# Patient Record
Sex: Female | Born: 1945 | Hispanic: No | Marital: Married
Health system: Southern US, Community
[De-identification: ages and names within clinical notes are randomized; demographics above are authoritative.]

## PROBLEM LIST (undated history)

## (undated) HISTORY — PX: BREAST EXCISIONAL BIOPSY: SUR124

---

## 1965-11-10 HISTORY — PX: BREAST BIOPSY: SHX20

## 2004-07-21 ENCOUNTER — Ambulatory Visit: Payer: Self-pay | Admitting: Unknown Physician Specialty

## 2005-08-10 ENCOUNTER — Ambulatory Visit: Payer: Self-pay | Admitting: Unknown Physician Specialty

## 2006-08-16 ENCOUNTER — Ambulatory Visit: Payer: Self-pay | Admitting: Unknown Physician Specialty

## 2007-08-29 ENCOUNTER — Ambulatory Visit: Payer: Self-pay | Admitting: Unknown Physician Specialty

## 2009-06-19 ENCOUNTER — Ambulatory Visit: Payer: Self-pay | Admitting: Unknown Physician Specialty

## 2009-07-04 ENCOUNTER — Ambulatory Visit: Payer: Self-pay | Admitting: Unknown Physician Specialty

## 2009-09-11 ENCOUNTER — Ambulatory Visit: Payer: Self-pay | Admitting: Unknown Physician Specialty

## 2009-11-11 ENCOUNTER — Ambulatory Visit: Payer: Self-pay | Admitting: Specialist

## 2010-09-14 ENCOUNTER — Ambulatory Visit: Payer: Self-pay | Admitting: Unknown Physician Specialty

## 2011-09-22 ENCOUNTER — Ambulatory Visit: Payer: Self-pay

## 2012-03-22 ENCOUNTER — Ambulatory Visit: Payer: Self-pay | Admitting: Family Medicine

## 2012-10-25 ENCOUNTER — Ambulatory Visit: Payer: Self-pay | Admitting: Family Medicine

## 2013-11-23 ENCOUNTER — Ambulatory Visit: Payer: Self-pay | Admitting: Family Medicine

## 2014-11-12 ENCOUNTER — Other Ambulatory Visit: Payer: Self-pay | Admitting: Family Medicine

## 2014-11-12 DIAGNOSIS — Z1231 Encounter for screening mammogram for malignant neoplasm of breast: Secondary | ICD-10-CM

## 2014-12-03 ENCOUNTER — Ambulatory Visit
Admission: RE | Admit: 2014-12-03 | Discharge: 2014-12-03 | Disposition: A | Discharge status: Home / Self Care | Payer: Medicare Other | Source: Ambulatory Visit | Attending: Family Medicine | Admitting: Family Medicine

## 2014-12-03 DIAGNOSIS — Z1231 Encounter for screening mammogram for malignant neoplasm of breast: Secondary | ICD-10-CM | POA: Insufficient documentation

## 2015-05-27 ENCOUNTER — Encounter (INDEPENDENT_AMBULATORY_CARE_PROVIDER_SITE_OTHER): Payer: Medicare Other | Admitting: Ophthalmology

## 2015-05-27 DIAGNOSIS — H43813 Vitreous degeneration, bilateral: Secondary | ICD-10-CM | POA: Diagnosis not present

## 2015-05-27 DIAGNOSIS — H35372 Puckering of macula, left eye: Secondary | ICD-10-CM | POA: Diagnosis not present

## 2015-05-27 DIAGNOSIS — H2513 Age-related nuclear cataract, bilateral: Secondary | ICD-10-CM

## 2015-05-27 DIAGNOSIS — I10 Essential (primary) hypertension: Secondary | ICD-10-CM | POA: Diagnosis not present

## 2015-05-27 DIAGNOSIS — H35033 Hypertensive retinopathy, bilateral: Secondary | ICD-10-CM | POA: Diagnosis not present

## 2015-06-30 ENCOUNTER — Other Ambulatory Visit: Payer: Self-pay | Admitting: Family Medicine

## 2015-06-30 DIAGNOSIS — Z1231 Encounter for screening mammogram for malignant neoplasm of breast: Secondary | ICD-10-CM

## 2015-10-24 ENCOUNTER — Encounter (INDEPENDENT_AMBULATORY_CARE_PROVIDER_SITE_OTHER): Payer: Medicare Other | Admitting: Ophthalmology

## 2015-12-08 ENCOUNTER — Ambulatory Visit: Payer: Medicare Other

## 2015-12-11 ENCOUNTER — Ambulatory Visit
Admission: RE | Admit: 2015-12-11 | Discharge: 2015-12-11 | Disposition: A | Discharge status: Home / Self Care | Payer: Medicare Other | Source: Ambulatory Visit | Attending: Family Medicine | Admitting: Family Medicine

## 2015-12-11 ENCOUNTER — Other Ambulatory Visit: Payer: Self-pay | Admitting: Family Medicine

## 2015-12-11 DIAGNOSIS — Z1231 Encounter for screening mammogram for malignant neoplasm of breast: Secondary | ICD-10-CM

## 2016-07-07 ENCOUNTER — Other Ambulatory Visit: Payer: Self-pay | Admitting: Family Medicine

## 2016-07-07 DIAGNOSIS — Z1231 Encounter for screening mammogram for malignant neoplasm of breast: Secondary | ICD-10-CM

## 2016-12-22 ENCOUNTER — Ambulatory Visit
Admission: RE | Admit: 2016-12-22 | Discharge: 2016-12-22 | Disposition: A | Discharge status: Home / Self Care | Payer: Medicare Other | Source: Ambulatory Visit | Attending: Family Medicine | Admitting: Family Medicine

## 2016-12-22 DIAGNOSIS — Z1231 Encounter for screening mammogram for malignant neoplasm of breast: Secondary | ICD-10-CM | POA: Diagnosis not present

## 2017-02-17 ENCOUNTER — Other Ambulatory Visit: Payer: Self-pay | Admitting: Family Medicine

## 2017-02-17 DIAGNOSIS — M8580 Other specified disorders of bone density and structure, unspecified site: Secondary | ICD-10-CM

## 2017-02-17 DIAGNOSIS — Z780 Asymptomatic menopausal state: Secondary | ICD-10-CM

## 2017-03-23 ENCOUNTER — Inpatient Hospital Stay: Admission: RE | Admit: 2017-03-23 | Payer: Medicare Other | Source: Ambulatory Visit

## 2017-04-25 ENCOUNTER — Inpatient Hospital Stay: Admission: RE | Admit: 2017-04-25 | Payer: Medicare Other | Source: Ambulatory Visit

## 2017-04-26 ENCOUNTER — Ambulatory Visit
Admission: RE | Admit: 2017-04-26 | Discharge: 2017-04-26 | Disposition: A | Discharge status: Home / Self Care | Payer: Medicare Other | Source: Ambulatory Visit | Attending: Family Medicine | Admitting: Family Medicine

## 2017-04-26 DIAGNOSIS — Z780 Asymptomatic menopausal state: Secondary | ICD-10-CM | POA: Insufficient documentation

## 2017-04-26 DIAGNOSIS — M85851 Other specified disorders of bone density and structure, right thigh: Secondary | ICD-10-CM | POA: Diagnosis not present

## 2017-04-26 DIAGNOSIS — M8580 Other specified disorders of bone density and structure, unspecified site: Secondary | ICD-10-CM

## 2017-04-26 DIAGNOSIS — Z13820 Encounter for screening for osteoporosis: Secondary | ICD-10-CM | POA: Diagnosis present

## 2017-08-02 ENCOUNTER — Other Ambulatory Visit: Payer: Self-pay | Admitting: Family Medicine

## 2017-08-02 DIAGNOSIS — Z1239 Encounter for other screening for malignant neoplasm of breast: Secondary | ICD-10-CM

## 2017-12-27 ENCOUNTER — Ambulatory Visit
Admission: RE | Admit: 2017-12-27 | Discharge: 2017-12-27 | Disposition: A | Discharge status: Home / Self Care | Payer: Medicare Other | Source: Ambulatory Visit | Attending: Family Medicine | Admitting: Family Medicine

## 2017-12-27 DIAGNOSIS — Z1231 Encounter for screening mammogram for malignant neoplasm of breast: Secondary | ICD-10-CM | POA: Diagnosis present

## 2017-12-27 DIAGNOSIS — Z1239 Encounter for other screening for malignant neoplasm of breast: Secondary | ICD-10-CM

## 2018-11-14 ENCOUNTER — Other Ambulatory Visit: Payer: Self-pay | Admitting: Family Medicine

## 2018-11-14 DIAGNOSIS — Z1231 Encounter for screening mammogram for malignant neoplasm of breast: Secondary | ICD-10-CM

## 2019-01-01 ENCOUNTER — Ambulatory Visit: Payer: Medicare Other

## 2019-01-23 ENCOUNTER — Ambulatory Visit
Admission: RE | Admit: 2019-01-23 | Discharge: 2019-01-23 | Disposition: A | Discharge status: Home / Self Care | Payer: Medicare Other | Source: Ambulatory Visit | Attending: Family Medicine | Admitting: Family Medicine

## 2019-01-23 ENCOUNTER — Other Ambulatory Visit: Payer: Self-pay

## 2019-01-23 ENCOUNTER — Encounter (INDEPENDENT_AMBULATORY_CARE_PROVIDER_SITE_OTHER): Payer: Self-pay

## 2019-01-23 DIAGNOSIS — Z1231 Encounter for screening mammogram for malignant neoplasm of breast: Secondary | ICD-10-CM | POA: Diagnosis present

## 2019-05-18 ENCOUNTER — Other Ambulatory Visit: Payer: Self-pay

## 2019-05-18 ENCOUNTER — Ambulatory Visit: Payer: Medicare Other | Attending: Internal Medicine

## 2019-05-18 DIAGNOSIS — Z23 Encounter for immunization: Secondary | ICD-10-CM | POA: Insufficient documentation

## 2019-05-18 NOTE — Progress Notes (Signed)
   Covid-19 Vaccination Clinic  Name:  Morgan Wells    MRN: [id number] DOB: 1946-01-22  05/18/2019  [name] was observed post Covid-19 immunization for 15 minutes without incidence. She was provided with Vaccine Information Sheet and instruction to access the V-Safe system.   [name] was instructed to call 911 with any severe reactions post vaccine: Marland Kitchen Difficulty breathing  . Swelling of your face and throat  . A fast heartbeat  . A bad rash all over your body  . Dizziness and weakness    Immunizations Administered    Name Date Dose VIS Date Route   Moderna COVID-19 Vaccine 05/18/2019  4:53 PM 0.5 mL 03/13/2019 Intramuscular   Manufacturer: Moderna   Lot: [id number]   NDC: 56314-970-26

## 2019-06-19 ENCOUNTER — Ambulatory Visit: Payer: Medicare Other | Attending: Internal Medicine

## 2019-06-19 DIAGNOSIS — Z23 Encounter for immunization: Secondary | ICD-10-CM | POA: Insufficient documentation

## 2019-06-19 NOTE — Progress Notes (Signed)
   Covid-19 Vaccination Clinic  Name:  Morgan Wells    MRN: [id number] DOB: [birth year]  06/19/2019  [name] was observed post Covid-19 immunization for 15 minutes without incident. She was provided with Vaccine Information Sheet and instruction to access the V-Safe system.   [name] was instructed to call 911 with any severe reactions post vaccine: Marland Kitchen Difficulty breathing  . Swelling of face and throat  . A fast heartbeat  . A bad rash all over body  . Dizziness and weakness   Immunizations Administered    Name Date Dose VIS Date Route   Moderna COVID-19 Vaccine 06/19/2019  1:07 PM 0.5 mL 03/13/2019 Intramuscular   Manufacturer: Moderna   Lot: [id number]   NDC: 45146-047-99

## 2019-11-19 ENCOUNTER — Other Ambulatory Visit: Payer: Self-pay | Admitting: Family Medicine

## 2019-11-19 DIAGNOSIS — Z1231 Encounter for screening mammogram for malignant neoplasm of breast: Secondary | ICD-10-CM

## 2020-01-24 ENCOUNTER — Ambulatory Visit
Admission: RE | Admit: 2020-01-24 | Discharge: 2020-01-24 | Disposition: A | Discharge status: Home / Self Care | Payer: Medicare Other | Source: Ambulatory Visit | Attending: Family Medicine | Admitting: Family Medicine

## 2020-01-24 ENCOUNTER — Other Ambulatory Visit: Payer: Self-pay

## 2020-01-24 ENCOUNTER — Encounter (INDEPENDENT_AMBULATORY_CARE_PROVIDER_SITE_OTHER): Payer: Self-pay

## 2020-01-24 DIAGNOSIS — Z1231 Encounter for screening mammogram for malignant neoplasm of breast: Secondary | ICD-10-CM | POA: Insufficient documentation

## 2020-12-25 ENCOUNTER — Other Ambulatory Visit: Payer: Self-pay | Admitting: Family Medicine

## 2020-12-25 DIAGNOSIS — Z1231 Encounter for screening mammogram for malignant neoplasm of breast: Secondary | ICD-10-CM

## 2021-02-02 ENCOUNTER — Ambulatory Visit: Payer: Medicare Other

## 2021-02-04 ENCOUNTER — Other Ambulatory Visit: Payer: Self-pay

## 2021-02-04 ENCOUNTER — Ambulatory Visit
Admission: RE | Admit: 2021-02-04 | Discharge: 2021-02-04 | Disposition: A | Discharge status: Home / Self Care | Payer: Medicare Other | Source: Ambulatory Visit | Attending: Family Medicine | Admitting: Family Medicine

## 2021-02-04 DIAGNOSIS — Z1231 Encounter for screening mammogram for malignant neoplasm of breast: Secondary | ICD-10-CM | POA: Insufficient documentation

## 2021-07-01 ENCOUNTER — Other Ambulatory Visit: Payer: Self-pay | Admitting: Family Medicine

## 2021-07-01 DIAGNOSIS — Z1231 Encounter for screening mammogram for malignant neoplasm of breast: Secondary | ICD-10-CM

## 2022-01-12 ENCOUNTER — Other Ambulatory Visit: Payer: Self-pay | Admitting: Family Medicine

## 2022-01-12 DIAGNOSIS — E2839 Other primary ovarian failure: Secondary | ICD-10-CM

## 2022-02-08 ENCOUNTER — Ambulatory Visit
Admission: RE | Admit: 2022-02-08 | Discharge: 2022-02-08 | Disposition: A | Discharge status: Home / Self Care | Payer: Medicare Other | Source: Ambulatory Visit | Attending: Family Medicine | Admitting: Family Medicine

## 2022-02-08 DIAGNOSIS — Z1231 Encounter for screening mammogram for malignant neoplasm of breast: Secondary | ICD-10-CM | POA: Insufficient documentation

## 2022-02-10 ENCOUNTER — Other Ambulatory Visit: Payer: Self-pay | Admitting: Family Medicine

## 2022-02-17 ENCOUNTER — Other Ambulatory Visit: Payer: Self-pay | Admitting: Family Medicine

## 2022-02-17 DIAGNOSIS — R928 Other abnormal and inconclusive findings on diagnostic imaging of breast: Secondary | ICD-10-CM

## 2022-02-17 DIAGNOSIS — R921 Mammographic calcification found on diagnostic imaging of breast: Secondary | ICD-10-CM

## 2022-02-24 ENCOUNTER — Ambulatory Visit
Admission: RE | Admit: 2022-02-24 | Discharge: 2022-02-24 | Disposition: A | Discharge status: Home / Self Care | Payer: Medicare Other | Source: Ambulatory Visit | Attending: Family Medicine | Admitting: Family Medicine

## 2022-02-24 DIAGNOSIS — R921 Mammographic calcification found on diagnostic imaging of breast: Secondary | ICD-10-CM | POA: Diagnosis present

## 2022-02-24 DIAGNOSIS — R928 Other abnormal and inconclusive findings on diagnostic imaging of breast: Secondary | ICD-10-CM | POA: Insufficient documentation

## 2022-02-28 IMAGING — MG MM DIGITAL SCREENING BILAT W/ TOMO AND CAD
8 series · 8 of 24 positions shown · non-contrast
Comparison: Previous exam(s).

CLINICAL DATA: Screening.

EXAM:
DIGITAL SCREENING BILATERAL MAMMOGRAM WITH TOMOSYNTHESIS AND CAD
TECHNIQUE: Bilateral screening digital craniocaudal and mediolateral oblique
mammograms were obtained. Bilateral screening digital breast
tomosynthesis was performed. The images were evaluated with
computer-aided detection.

[R CC synth-2D]
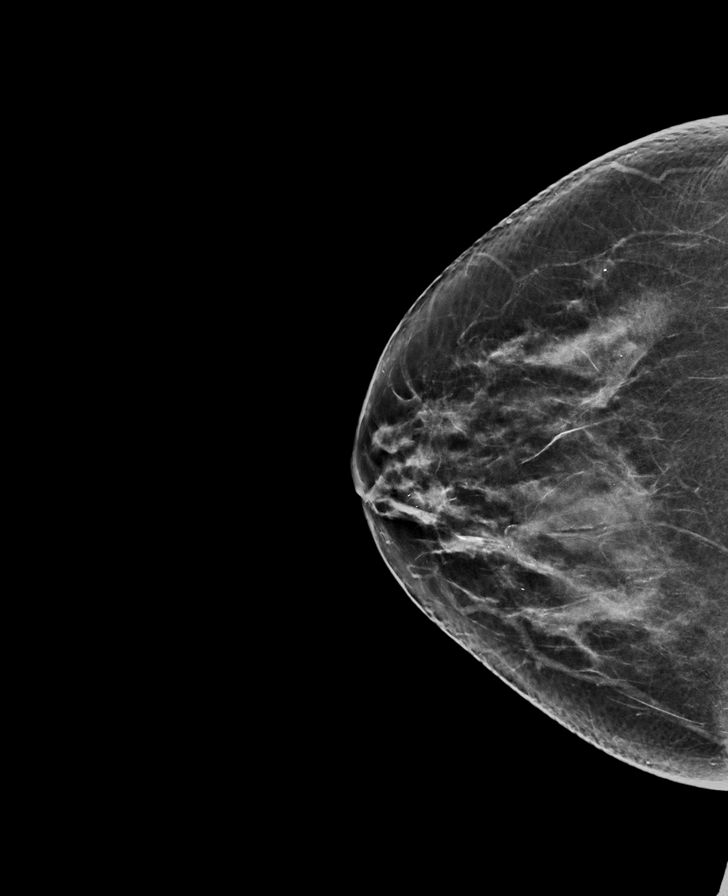

[L MLO synth-2D]
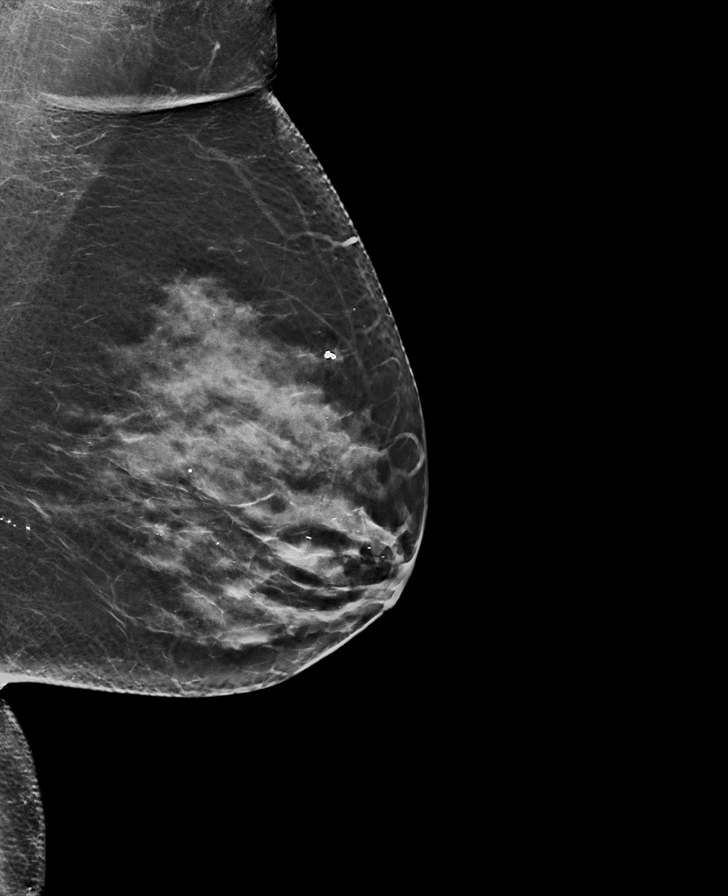

[R MLO synth-2D]
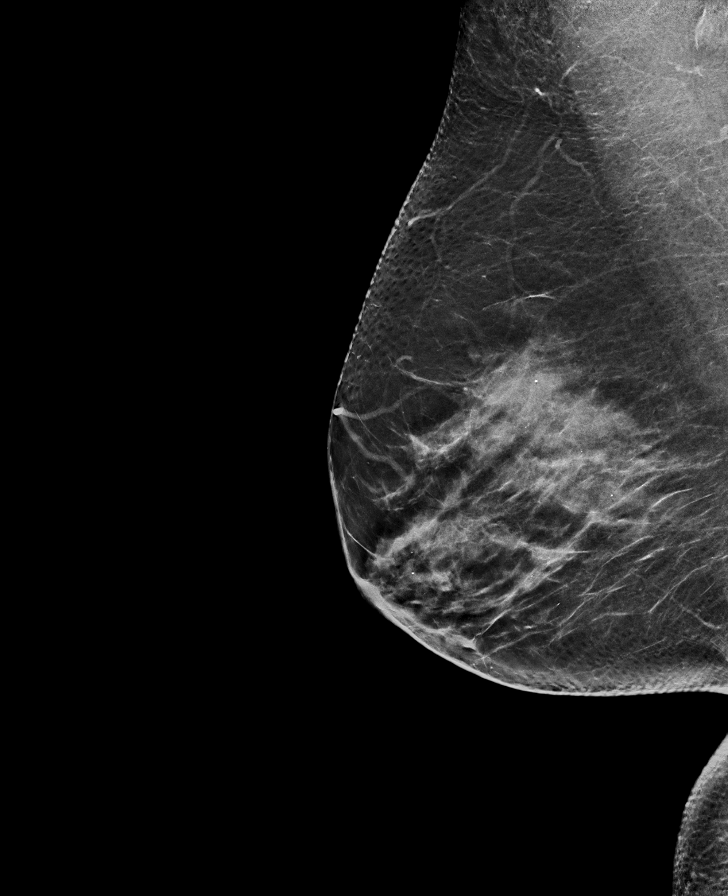

[L CC synth-2D]
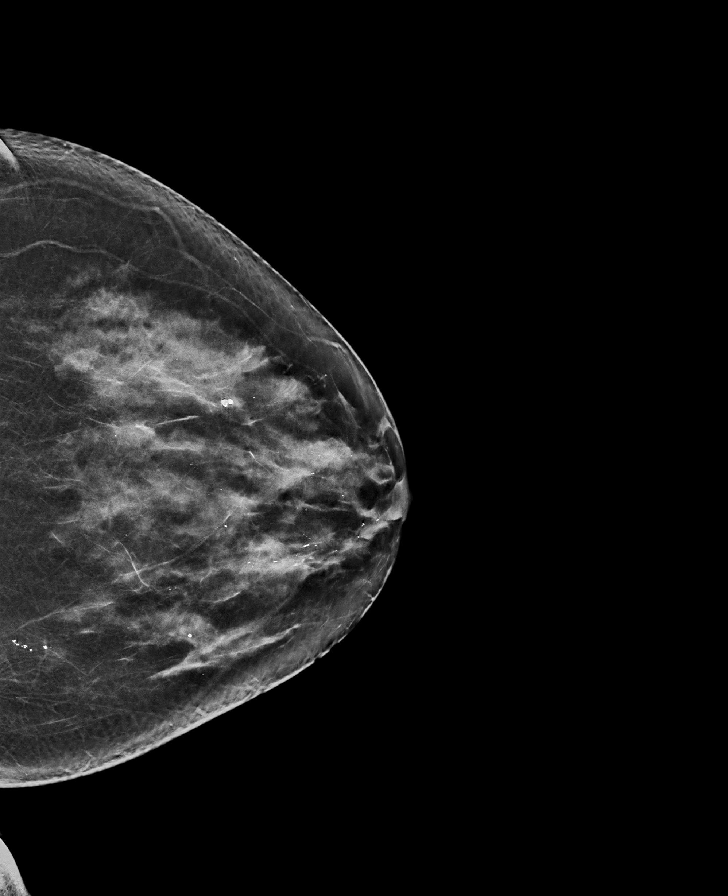

[R MLO tomo · tomo slice 37/74.0]
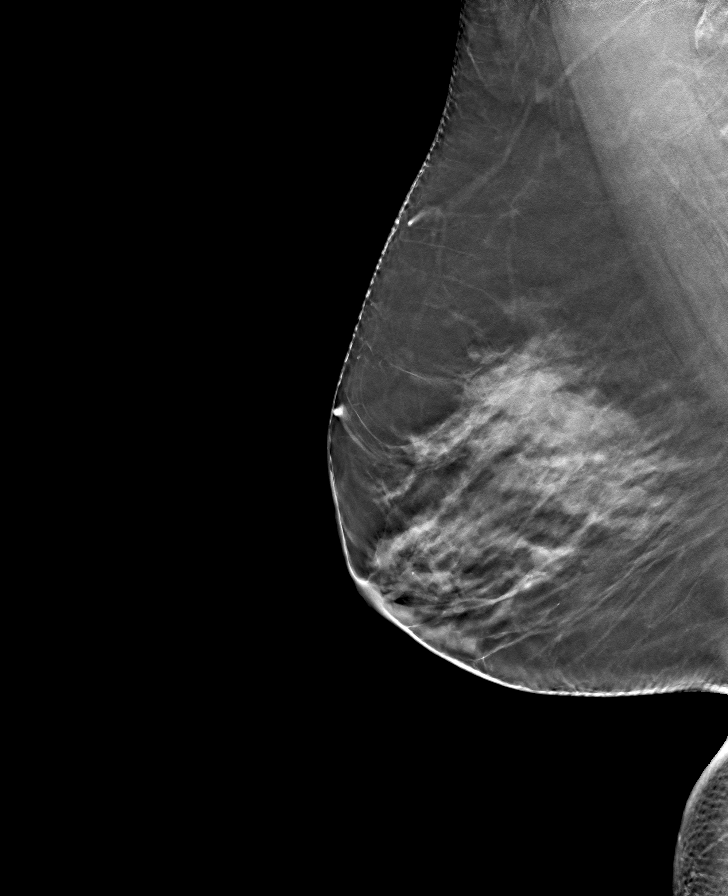

[L CC tomo · tomo slice 37/72.0]
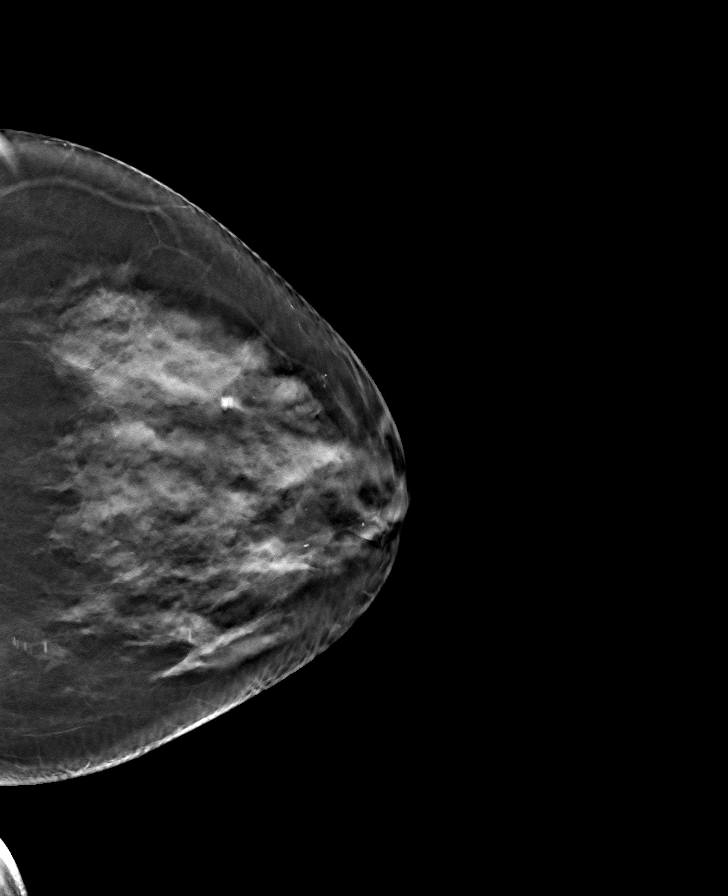

[R CC tomo · tomo slice 37/72.0]
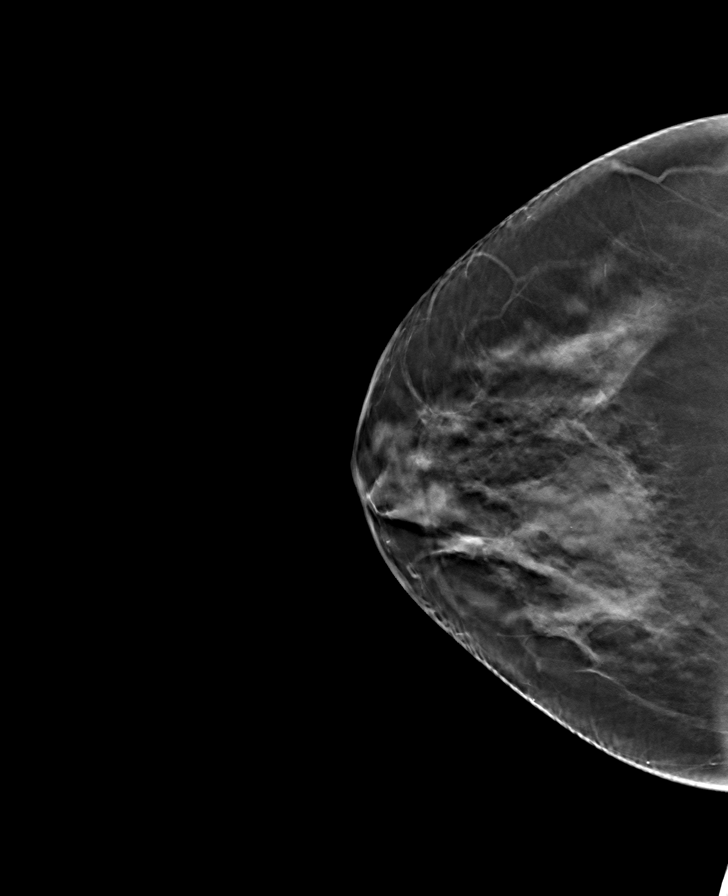

[L MLO tomo · tomo slice 37/73.0]
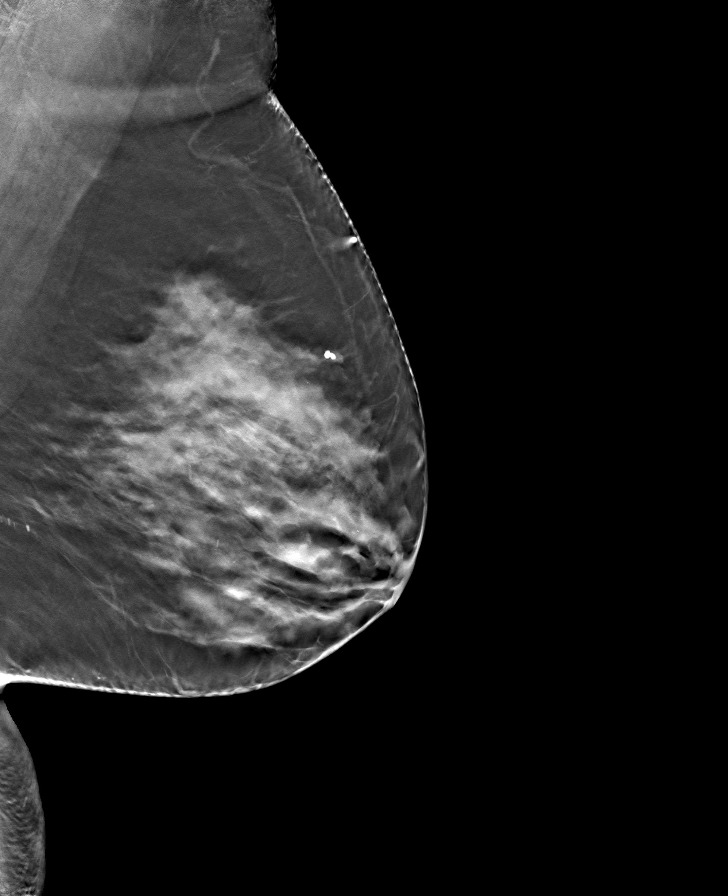

[8 of 24 positions shown; findings below may reference images not displayed]

ACR Breast Density Category c: The breast tissue is heterogeneously
dense, which may obscure small masses.
FINDINGS: There are no findings suspicious for malignancy.
IMPRESSION: No mammographic evidence of malignancy. A result letter of this
screening mammogram will be mailed directly to the patient.

RECOMMENDATION:
Screening mammogram in one year. (Code:[id number])

BI-RADS CATEGORY  1: Negative.

## 2022-03-24 ENCOUNTER — Ambulatory Visit
Admission: RE | Admit: 2022-03-24 | Discharge: 2022-03-24 | Disposition: A | Discharge status: Home / Self Care | Payer: Medicare Other | Source: Ambulatory Visit | Attending: Family Medicine | Admitting: Family Medicine

## 2022-03-24 DIAGNOSIS — E2839 Other primary ovarian failure: Secondary | ICD-10-CM | POA: Insufficient documentation

## 2022-07-29 ENCOUNTER — Other Ambulatory Visit: Payer: Self-pay | Admitting: Family Medicine

## 2022-07-29 DIAGNOSIS — R921 Mammographic calcification found on diagnostic imaging of breast: Secondary | ICD-10-CM

## 2022-08-31 ENCOUNTER — Ambulatory Visit
Admission: RE | Admit: 2022-08-31 | Discharge: 2022-08-31 | Disposition: A | Discharge status: Home / Self Care | Payer: Medicare Other | Source: Ambulatory Visit | Attending: Family Medicine | Admitting: Family Medicine

## 2022-08-31 DIAGNOSIS — R921 Mammographic calcification found on diagnostic imaging of breast: Secondary | ICD-10-CM | POA: Insufficient documentation

## 2023-08-11 ENCOUNTER — Other Ambulatory Visit: Payer: Self-pay | Admitting: Family Medicine

## 2023-08-11 DIAGNOSIS — R921 Mammographic calcification found on diagnostic imaging of breast: Secondary | ICD-10-CM

## 2023-09-08 ENCOUNTER — Ambulatory Visit
Admission: RE | Admit: 2023-09-08 | Discharge: 2023-09-08 | Disposition: A | Discharge status: Home / Self Care | Source: Ambulatory Visit | Attending: Family Medicine | Admitting: Family Medicine

## 2023-09-08 DIAGNOSIS — R921 Mammographic calcification found on diagnostic imaging of breast: Secondary | ICD-10-CM | POA: Insufficient documentation
